# Patient Record
Sex: Male | Born: 1955 | Race: White | Hispanic: No | Marital: Married
Health system: Midwestern US, Community
[De-identification: ages and names within clinical notes are randomized; demographics above are authoritative.]

## PROBLEM LIST (undated history)

## (undated) DIAGNOSIS — E119 Type 2 diabetes mellitus without complications: Secondary | ICD-10-CM

## (undated) DIAGNOSIS — I10 Essential (primary) hypertension: Secondary | ICD-10-CM

## (undated) DIAGNOSIS — E785 Hyperlipidemia, unspecified: Secondary | ICD-10-CM

## (undated) DIAGNOSIS — I341 Nonrheumatic mitral (valve) prolapse: Secondary | ICD-10-CM

## (undated) DIAGNOSIS — E781 Pure hyperglyceridemia: Secondary | ICD-10-CM

## (undated) HISTORY — PX: CARPAL TUNNEL RELEASE: SHX101

## (undated) HISTORY — DX: Type 2 diabetes mellitus without complications: E11.9

## (undated) HISTORY — DX: Hyperlipidemia, unspecified: E78.5

## (undated) HISTORY — DX: Essential (primary) hypertension: I10

## (undated) HISTORY — DX: Nonrheumatic mitral (valve) prolapse: I34.1

## (undated) HISTORY — PX: ROTATOR CUFF REPAIR: SHX139

## (undated) HISTORY — DX: Pure hyperglyceridemia: E78.1

---

## 2014-10-07 ENCOUNTER — Other Ambulatory Visit (HOSPITAL_COMMUNITY): Payer: Self-pay | Admitting: Respiratory Therapy

## 2014-10-07 DIAGNOSIS — G4730 Sleep apnea, unspecified: Principal | ICD-10-CM

## 2014-10-07 DIAGNOSIS — G4710 Hypersomnia, unspecified: Secondary | ICD-10-CM

## 2014-10-23 ENCOUNTER — Ambulatory Visit: Payer: Self-pay

## 2015-01-13 ENCOUNTER — Other Ambulatory Visit (HOSPITAL_COMMUNITY): Payer: Self-pay | Admitting: Respiratory Therapy

## 2015-08-17 ENCOUNTER — Telehealth: Payer: Self-pay | Admitting: Internal Medicine

## 2015-08-17 NOTE — Telephone Encounter (Signed)
Received records from Dr Dwana MelenaZack Hall for appointment on 08/31/15 with Dr Rennis GoldenHilty.  [facility] for Dr Blanchie DessertHilty's schedule on 08/31/15. lp

## 2015-08-31 ENCOUNTER — Ambulatory Visit: Payer: Self-pay | Admitting: Internal Medicine

## 2015-09-10 ENCOUNTER — Ambulatory Visit (INDEPENDENT_AMBULATORY_CARE_PROVIDER_SITE_OTHER): Payer: Managed Care, Other (non HMO) | Admitting: Internal Medicine

## 2015-09-10 ENCOUNTER — Encounter: Payer: Self-pay | Admitting: Internal Medicine

## 2015-09-10 VITALS — BP 150/76 | HR 79 | Ht 72.0 in | Wt 268.0 lb

## 2015-09-10 DIAGNOSIS — R011 Cardiac murmur, unspecified: Secondary | ICD-10-CM

## 2015-09-10 DIAGNOSIS — M7989 Other specified soft tissue disorders: Secondary | ICD-10-CM

## 2015-09-10 DIAGNOSIS — E1100 Type 2 diabetes mellitus with hyperosmolarity without nonketotic hyperglycemic-hyperosmolar coma (NKHHC): Secondary | ICD-10-CM

## 2015-09-10 DIAGNOSIS — R5383 Other fatigue: Secondary | ICD-10-CM | POA: Diagnosis not present

## 2015-09-10 DIAGNOSIS — R0602 Shortness of breath: Secondary | ICD-10-CM

## 2015-09-10 DIAGNOSIS — E785 Hyperlipidemia, unspecified: Secondary | ICD-10-CM | POA: Insufficient documentation

## 2015-09-10 DIAGNOSIS — E119 Type 2 diabetes mellitus without complications: Secondary | ICD-10-CM | POA: Insufficient documentation

## 2015-09-10 DIAGNOSIS — I10 Essential (primary) hypertension: Secondary | ICD-10-CM

## 2015-09-10 NOTE — Progress Notes (Signed)
OFFICE NOTE  Chief Complaint:  DOE, weight gain, swelling  Primary Care Physician: No primary care provider on file.  HPI:  Dwayne Kennedy is a 60 y.o. male with a past medical history significant for mitral valve prolapse as diagnosed by a cardiologist in [place], Dr. Jerene Bears. Apparently he had chest pain and underwent echocardiogram and stress testing when he was age 65, which was 19 years ago. At that time he had a stress test which was abnormal and underwent diagnostic heart catheterization by his report which showed no significant coronary disease. He generally did well and was followed by the cardiologist for at least another 10 years but has not seen one since moving to [place]. Recently he's become more short of breath with exertion. He says his gained about 30-40 pounds over the past 3 years. He's recently had worsening swelling in his ankles and recently fractured his right heel. He gets short of breath when bending over and with exertion. He denies any chest pain. Although he is active at his job he denies any exercise. He does use at least a moderate amount of alcohol drinking approximately 12 alcoholic beverages per week. He's never been a smoker. He does have risk factors including diabetes, hypertension and dyslipidemia. He denies any orthopnea, PND or other heart failure symptoms. He has noted progressive fatigue. He reports having had a home sleep study which was mildly abnormal and may investigate getting an oral appliance to treat very mild sleep apnea.  PMHx:  Past Medical History  Diagnosis Date  . Diabetes mellitus without complication (HCC)   . Hyperlipidemia   . Hypertension   . Mitral valve prolapse     No past surgical history on file.  FAMHx:  Family History  Problem Relation Age of Onset  . Cancer Mother   . Diabetes Father   . Heart disease Father     SOCHx:   reports that he has been passively smoking.  He does not have any smokeless tobacco  history on file. He reports that he drinks about 7.2 oz of alcohol per week. He reports that he does not use illicit drugs.  ALLERGIES:  No Known Allergies  ROS: Pertinent items noted in HPI and remainder of comprehensive ROS otherwise negative.  HOME MEDS: Current Outpatient Prescriptions  Medication Sig Dispense Refill  . aspirin 81 MG tablet Take 81 mg by mouth daily.    Marland Kitchen ezetimibe (ZETIA) 10 MG tablet Take 10 mg by mouth daily.    . fenofibrate (TRICOR) 48 MG tablet Take 48 mg by mouth daily.    . Magnesium 250 MG TABS Take 2 tablets by mouth daily.    . metFORMIN (GLUCOPHAGE) 500 MG tablet Take 500 mg by mouth 2 (two) times daily with a meal.    . Multiple Vitamins-Minerals (MULTIVITAMIN ADULT PO) Take 1 tablet by mouth daily. Centrum    . ramipril (ALTACE) 5 MG capsule Take 5 mg by mouth daily.    . rosuvastatin (CRESTOR) 40 MG tablet Take 40 mg by mouth daily.     No current facility-administered medications for this visit.    LABS/IMAGING: No results found for this or any previous visit (from the past 48 hour(s)). No results found.  WEIGHTS: Wt Readings from Last 3 Encounters:  09/10/15 268 lb (121.564 kg)    VITALS: BP 150/76 mmHg  Pulse 79  Ht 6' (1.829 m)  Wt 268 lb (121.564 kg)  BMI 36.34 kg/m2  EXAM: General appearance:  alert and no distress Neck: no carotid bruit, no JVD and thyroid not enlarged, symmetric, no tenderness/mass/nodules Lungs: clear to auscultation bilaterally Heart: regular rate and rhythm, S1, S2 normal, systolic murmur: early systolic 2/6, blowing at apex and mid systolic click present Abdomen: soft, non-tender; bowel sounds normal; no masses,  no organomegaly and obese, protuberant Extremities: edema trace bilateral pedal edema Pulses: 2+ and symmetric Skin: Skin color, texture, turgor normal. No rashes or lesions Neurologic: Grossly normal Psych: Pleasant  EKG: Normal sinus rhythm, right bundle branch  block  ASSESSMENT: 1. Progressive dyspnea and exertion 2. RBBB 3. Weight gain with edema 4. History of mitral valve prolapse/murmur 5. Hypertension 6. Dyslipidemia 7. Type 2 diabetes  PLAN: 1.   Mr. Dwayne Kennedy is had some worsening shortness of breath with exertion and weight gain that is been over the past 3-4 years. I suspect given his protuberant abdomen that he has some restriction leading to shortness of breath. He also has some leg swelling. I like to rule out any right heart dysfunction as there is evidence of right bundle branch block on EKG. We'll go ahead and order an echocardiogram. We'll also go ahead and check an exercise stress test to rule out ischemia. Although he had normal coronaries by report almost 20 years ago, he has multiple coronary risk factors. We'll try to obtain his old cath report if possible. Unfortunately, he tells me he is traveling next week therefore we'll delay stress testing probably until he gets back. He should be able to get those at [facility] for his convenience.  Follow-up in several weeks after stress test.  Chrystie NoseKenneth C. Aliyana Dlugosz, MD, [facility] Attending Cardiologist [facility]  Chrystie NoseKenneth C Annabell Oconnor 09/10/2015, 5:15 PM

## 2015-09-10 NOTE — Patient Instructions (Addendum)
Your physician has requested that you have an echocardiogram @ [facility]. Echocardiography is a painless test that uses sound waves to create images of your heart. It provides your doctor with information about the size and shape of your heart and how well your heart's chambers and valves are working. This procedure takes approximately one hour. There are no restrictions for this procedure.  Your physician has requested that you have an exercise stress myoview @ [facility]. For further information please visit [email or web address]. Please follow instruction sheet, as given.  Your physician recommends that you schedule a follow-up appointment after your testing.   Records request faxed to Dr. Altha Harmavid Cutler in [place] ([phone]) [phone]

## 2015-09-13 ENCOUNTER — Encounter: Payer: Self-pay | Admitting: *Deleted

## 2015-09-13 NOTE — Telephone Encounter (Signed)
[facility], sent to [employer] and scanning.

## 2015-09-22 ENCOUNTER — Telehealth: Payer: Self-pay | Admitting: Internal Medicine

## 2015-09-22 NOTE — Telephone Encounter (Signed)
[facility].  Records given to Dr Rennis GoldenHilty for review.

## 2015-09-24 ENCOUNTER — Ambulatory Visit (HOSPITAL_COMMUNITY)
Admission: RE | Admit: 2015-09-24 | Discharge: 2015-09-24 | Disposition: A | Discharge status: Home / Self Care | Payer: Managed Care, Other (non HMO) | Source: Ambulatory Visit | Attending: Internal Medicine | Admitting: Internal Medicine

## 2015-09-24 ENCOUNTER — Encounter (HOSPITAL_COMMUNITY): Admission: RE | Admit: 2015-09-24 | Payer: Managed Care, Other (non HMO) | Source: Ambulatory Visit

## 2015-09-24 ENCOUNTER — Encounter (HOSPITAL_COMMUNITY)
Admission: RE | Admit: 2015-09-24 | Discharge: 2015-09-24 | Disposition: A | Discharge status: Home / Self Care | Payer: Managed Care, Other (non HMO) | Source: Ambulatory Visit | Attending: Internal Medicine | Admitting: Internal Medicine

## 2015-09-24 ENCOUNTER — Inpatient Hospital Stay (HOSPITAL_COMMUNITY): Admission: RE | Admit: 2015-09-24 | Payer: Managed Care, Other (non HMO) | Source: Ambulatory Visit

## 2015-09-24 DIAGNOSIS — M7989 Other specified soft tissue disorders: Secondary | ICD-10-CM

## 2015-09-24 DIAGNOSIS — R0602 Shortness of breath: Secondary | ICD-10-CM | POA: Diagnosis not present

## 2015-09-24 DIAGNOSIS — R5383 Other fatigue: Secondary | ICD-10-CM | POA: Insufficient documentation

## 2015-09-24 DIAGNOSIS — R011 Cardiac murmur, unspecified: Secondary | ICD-10-CM | POA: Diagnosis not present

## 2015-09-24 DIAGNOSIS — R0600 Dyspnea, unspecified: Secondary | ICD-10-CM | POA: Diagnosis present

## 2015-09-24 DIAGNOSIS — E785 Hyperlipidemia, unspecified: Secondary | ICD-10-CM | POA: Insufficient documentation

## 2015-09-24 DIAGNOSIS — E119 Type 2 diabetes mellitus without complications: Secondary | ICD-10-CM | POA: Diagnosis not present

## 2015-09-24 DIAGNOSIS — I510 Cardiac septal defect, acquired: Secondary | ICD-10-CM | POA: Insufficient documentation

## 2015-09-24 NOTE — Progress Notes (Signed)
*  PRELIMINARY RESULTS* Echocardiogram 2D Echocardiogram has been performed.  Jeryl Columbialliott, Tephanie Escorcia 09/24/2015, 9:30 AM

## 2015-09-27 ENCOUNTER — Encounter (HOSPITAL_BASED_OUTPATIENT_CLINIC_OR_DEPARTMENT_OTHER)
Admission: RE | Admit: 2015-09-27 | Discharge: 2015-09-27 | Disposition: A | Discharge status: Home / Self Care | Payer: Managed Care, Other (non HMO) | Source: Ambulatory Visit | Attending: Internal Medicine | Admitting: Internal Medicine

## 2015-09-27 ENCOUNTER — Encounter (HOSPITAL_COMMUNITY): Payer: Self-pay

## 2015-09-27 ENCOUNTER — Inpatient Hospital Stay (HOSPITAL_COMMUNITY): Admission: RE | Admit: 2015-09-27 | Payer: Managed Care, Other (non HMO) | Source: Ambulatory Visit

## 2015-09-27 ENCOUNTER — Encounter (HOSPITAL_COMMUNITY)
Admission: RE | Admit: 2015-09-27 | Discharge: 2015-09-27 | Disposition: A | Discharge status: Home / Self Care | Payer: Managed Care, Other (non HMO) | Source: Ambulatory Visit | Attending: Internal Medicine | Admitting: Internal Medicine

## 2015-09-27 DIAGNOSIS — I510 Cardiac septal defect, acquired: Secondary | ICD-10-CM | POA: Diagnosis not present

## 2015-09-27 DIAGNOSIS — R0602 Shortness of breath: Secondary | ICD-10-CM | POA: Diagnosis present

## 2015-09-27 DIAGNOSIS — R5383 Other fatigue: Secondary | ICD-10-CM

## 2015-09-27 LAB — NM MYOCAR MULTI W/SPECT W/WALL MOTION / EF
CHL CUP NUCLEAR SDS: 0
CHL CUP RESTING HR STRESS: 68 {beats}/min
CHL RATE OF PERCEIVED EXERTION: 15
CSEPEDS: 48 s
CSEPEW: 10.1 METS
Exercise duration (min): 7 min
LV dias vol: 95 mL (ref 62–150)
LVSYSVOL: 38 mL
MPHR: 161 {beats}/min
Peak HR: 157 {beats}/min
Percent HR: 97 %
RATE: 0.35
SRS: 2
SSS: 2
TID: 1

## 2015-09-27 MED ORDER — SODIUM CHLORIDE 0.9% FLUSH
INTRAVENOUS | Status: AC
  Administered 2015-09-27: 10 mL via INTRAVENOUS
  Filled 2015-09-27: qty 10

## 2015-09-27 MED ORDER — TECHNETIUM TC 99M SESTAMIBI GENERIC - CARDIOLITE
10.0000 | Freq: Once | INTRAVENOUS | Status: AC | PRN
  Administered 2015-09-27: 9.97 via INTRAVENOUS

## 2015-09-27 MED ORDER — TECHNETIUM TC 99M SESTAMIBI - CARDIOLITE
30.0000 | Freq: Once | INTRAVENOUS | Status: AC | PRN
  Administered 2015-09-27: 30 via INTRAVENOUS

## 2015-09-27 MED ORDER — REGADENOSON 0.4 MG/5ML IV SOLN
INTRAVENOUS | Status: DC
Start: 2015-09-27 — End: 2015-09-27
  Filled 2015-09-27: qty 5

## 2015-09-29 ENCOUNTER — Telehealth: Payer: Self-pay | Admitting: Internal Medicine

## 2015-09-29 NOTE — Telephone Encounter (Signed)
New message ° ° ° ° °Returning a call to the nurse °

## 2015-09-29 NOTE — Telephone Encounter (Signed)
Returned call to pt and gave him results of echo per Dr Blanchie DessertHilty's comment. Pt verbalized understanding.

## 2015-10-08 ENCOUNTER — Encounter: Payer: Self-pay | Admitting: Internal Medicine

## 2015-10-08 ENCOUNTER — Ambulatory Visit (INDEPENDENT_AMBULATORY_CARE_PROVIDER_SITE_OTHER): Payer: Managed Care, Other (non HMO) | Admitting: Internal Medicine

## 2015-10-08 VITALS — BP 156/95 | HR 75 | Ht 72.0 in | Wt 256.2 lb

## 2015-10-08 DIAGNOSIS — I10 Essential (primary) hypertension: Secondary | ICD-10-CM | POA: Diagnosis not present

## 2015-10-08 DIAGNOSIS — R0602 Shortness of breath: Secondary | ICD-10-CM

## 2015-10-08 DIAGNOSIS — M7989 Other specified soft tissue disorders: Secondary | ICD-10-CM | POA: Diagnosis not present

## 2015-10-08 DIAGNOSIS — R5383 Other fatigue: Secondary | ICD-10-CM

## 2015-10-08 NOTE — Patient Instructions (Addendum)
Your physician recommends that you schedule a follow-up appointment as needed  Patient is to call the office when he returns from traveling - Dr. Rennis GoldenHilty will discuss a diuretic at that time.

## 2015-10-09 NOTE — Progress Notes (Signed)
OFFICE NOTE  Chief Complaint:  Follow up tests  Primary Care Physician: Dwayne MelenaZack Hall, MD  HPI:  Dwayne Kennedy is a 60 y.o. male with a past medical history significant for mitral valve prolapse as diagnosed by a cardiologist in [place], Dr. Jerene Bearsavid Kennedy. Apparently he had chest pain and underwent echocardiogram and stress testing when he was age 60, which was 19 years ago. At that time he had a stress test which was abnormal and underwent diagnostic heart catheterization by his report which showed no significant coronary disease. He generally did well and was followed by the cardiologist for at least another 10 years but has not seen one since moving to [place]. Recently he's become more short of breath with exertion. He says his gained about 30-40 pounds over the past 3 years. He's recently had worsening swelling in his ankles and recently fractured his right heel. He gets short of breath when bending over and with exertion. He denies any chest pain. Although he is active at his job he denies any exercise. He does use at least a moderate amount of alcohol drinking approximately 12 alcoholic beverages per week. He's never been a smoker. He does have risk factors including diabetes, hypertension and dyslipidemia. He denies any orthopnea, PND or other heart failure symptoms. He has noted progressive fatigue. He reports having had a home sleep study which was mildly abnormal and may investigate getting an oral appliance to treat very mild sleep apnea.  10/08/2015  Dwayne Kennedy returns today for follow-up. He underwent nuclear stress testing as well as an echocardiogram. The echo demonstrated normal systolic and diastolic function with an EF of 60-65% and no significant valvular disease. His nuclear stress test indicated a low risk study without any reversible ischemia and a small inferior attenuation artifact. I received results from his [facility]. Dr.  Jerene Bearsavid Kennedy noted that he saw Dwayne Kennedy in 2010 and 2011. He referenced a cardiac catheterization in 2004 which was normal. There was also a stress echocardiogram performed in 2010 which was negative for ischemia. No clear cause for Dwayne Kennedy's chest discomfort was noted.  PMHx:  Past Medical History  Diagnosis Date  . Diabetes mellitus without complication (HCC)   . Hyperlipidemia   . Hypertension   . Mitral valve prolapse     Past Surgical History  Procedure Laterality Date  . Carpal tunnel release Right   . Rotator cuff repair Right     FAMHx:  Family History  Problem Relation Age of Onset  . Cancer Mother   . Diabetes Father   . Heart disease Father   . Other Child     meningitis - COD at age 365    SOCHx:   reports that he has been passively smoking.  He does not have any smokeless tobacco history on file. He reports that he drinks about 7.2 oz of alcohol per week. He reports that he does not use illicit drugs.  ALLERGIES:  No Known Allergies  ROS: Pertinent items noted in HPI and remainder of comprehensive ROS otherwise negative.  HOME MEDS: Current Outpatient Prescriptions  Medication Sig Dispense Refill  . aspirin 81 MG tablet Take 81 mg by mouth daily.    Marland Kitchen. ezetimibe (ZETIA) 10 MG tablet Take 10 mg by mouth daily.    . fenofibrate (TRICOR) 48 MG tablet Take 48 mg by mouth daily.    . Magnesium 250 MG TABS Take 2 tablets by mouth daily.    .Marland Kitchen  metFORMIN (GLUCOPHAGE) 500 MG tablet Take 500 mg by mouth 2 (two) times daily with a meal.    . Multiple Vitamins-Minerals (MULTIVITAMIN ADULT PO) Take 1 tablet by mouth daily. Centrum    . ramipril (ALTACE) 5 MG capsule Take 5 mg by mouth daily.    . rosuvastatin (CRESTOR) 40 MG tablet Take 40 mg by mouth daily.     No current facility-administered medications for this visit.    LABS/IMAGING: No results found for this or any previous visit (from the past 48 hour(s)). No results found.  WEIGHTS: Wt Readings from  Last 3 Encounters:  10/08/15 256 lb 3.2 oz (116.212 kg)  09/10/15 268 lb (121.564 kg)    VITALS: BP 156/95 mmHg  Pulse 75  Ht 6' (1.829 m)  Wt 256 lb 3.2 oz (116.212 kg)  BMI 34.74 kg/m2  EXAM: Deferred  EKG: Deferred  ASSESSMENT: 1. Progressive dyspnea and exertion- normal echo and low risk stress test findings, EF 60-65% 2. RBBB 3. Weight gain with edema 4. History of mitral valve prolapse/murmur - not seen on echo  5. Hypertension 6. Dyslipidemia 7. Type 2 diabetes  PLAN: 1.   Mr. Dwayne Kennedy at a low risk stress test and an echocardiogram which appears normal. There was no evidence of significant mitral valve prolapse as reported in the past. He's had a negative cardiac catheterization 2004 and a normal stress echocardiogram in 2010. Do not believe his symptoms are cardiac in nature. He may wish to pursue a pulmonary workup through his primary care provider. He continues to have some leg edema. This is likely due to some venous insufficiency. He would benefit from compression stockings and possibly a low-dose diuretic such as Lasix or chlorthalidone. Blood pressure was mildly elevated today. I offered to start chlorthalidone however he says he will be traveling next 3-4 weeks and would need to get blood work within 1 week of starting medication. He may contact our office to start that medication once he returns back to [place].  Dwayne Nose, MD, [facility] Attending Cardiologist [facility]  Dwayne Kennedy 10/09/2015, 11:34 AM

## 2016-07-20 DIAGNOSIS — E785 Hyperlipidemia, unspecified: Secondary | ICD-10-CM | POA: Insufficient documentation

## 2017-04-08 IMAGING — NM NM MYOCAR MULTI W/SPECT W/WALL MOTION & EF
2 series · 12 of 12 positions shown · non-contrast
Comparison: none

[Series 1: rest · 8.28mm/px · 6 of 64 frames shown]
[frame 6/64]
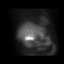
[frame 16/64]
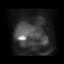
[frame 27/64]
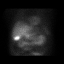
[frame 38/64]
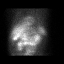
[frame 48/64]
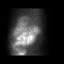
[frame 59/64]
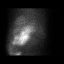

[Series 2: stress gated · 8.28mm/px · 6 of 64 frames shown]
[frame 6/64]
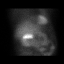
[frame 16/64]
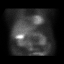
[frame 27/64]
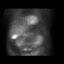
[frame 38/64]
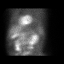
[frame 48/64]
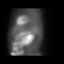
[frame 59/64]
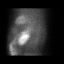

[12 of 12 positions shown; findings below may reference images not displayed]

Canned report from images found in remote index.

Refer to host system for actual result text.

## 2017-07-24 ENCOUNTER — Ambulatory Visit: Payer: Managed Care, Other (non HMO) | Admitting: General Surgery

## 2020-11-30 DIAGNOSIS — S299XXA Unspecified injury of thorax, initial encounter: Secondary | ICD-10-CM | POA: Insufficient documentation

## 2020-11-30 DIAGNOSIS — E66812 Obesity, class 2: Secondary | ICD-10-CM | POA: Insufficient documentation

## 2021-01-21 ENCOUNTER — Encounter: Payer: Self-pay | Admitting: Pulmonary Disease

## 2021-01-21 ENCOUNTER — Ambulatory Visit (INDEPENDENT_AMBULATORY_CARE_PROVIDER_SITE_OTHER): Payer: PRIVATE HEALTH INSURANCE | Admitting: Pulmonary Disease

## 2021-01-21 ENCOUNTER — Other Ambulatory Visit: Payer: Self-pay

## 2021-01-21 DIAGNOSIS — R0683 Snoring: Secondary | ICD-10-CM | POA: Diagnosis not present

## 2021-01-21 NOTE — Patient Instructions (Addendum)
Home sleep study 

## 2021-01-21 NOTE — Progress Notes (Signed)
Subjective:    Patient ID: Dwayne Kennedy, male    DOB: 1956-03-01, 65 y.o.   MRN: [id number]  HPI  Chief Complaint  Patient presents with   Consult    Patient snores, wakes up frequently and feels restless when sleeping. Denies that he stops breathing or gasping for air.     65 year old obese diabetic, hypertensive presents for evaluation of sleep disordered breathing. He reports undergoing a home sleep test in 2018 and was told that he had a mild condition.  His wife uses a CPAP machine and he is not very enthusiastic about using 1. His wife is noted loud snoring and he reports nonrefreshing sleep.  Epworth sleepiness score is 7 and reports some sleepiness while sitting and reading and watching TV. He denies gasping or choking episodes in his sleep. Bedtime is between 8 and 9 PM, sleep latency is up to an hour, he sleeps on his side with 1 pillow, reports 1-2 spontaneous nocturnal awakenings, is out of bed around 5 AM feeling tired without dryness of mouth or headaches. He has gained about 10 pounds in the last 2 years  There is no history suggestive of cataplexy, sleep paralysis or parasomnias   Past Medical History:  Diagnosis Date   Diabetes mellitus without complication (HCC)    Hyperlipidemia    Hypertension    Mitral valve prolapse    Past Surgical History:  Procedure Laterality Date   CARPAL TUNNEL RELEASE Right    ROTATOR CUFF REPAIR Right      No Known Allergies  Social History   Socioeconomic History   Marital status: Married    Spouse name: Not on file   Number of children: 2   Years of education: 12   Highest education level: Not on file  Occupational History   Occupation: Diplomatic Services operational officer    Employer: [employer]  Tobacco Use   Smoking status: Never    Passive exposure: Yes   Smokeless tobacco: Never   Tobacco comments:    smoked occasionally in high school  Vaping Use   Vaping Use: Never used  Substance and Sexual Activity   Alcohol use:  Yes    Alcohol/week: 12.0 standard drinks    Types: 12 Standard drinks or equivalent per week    Comment: 2-3 drinks daily   Drug use: No   Sexual activity: Not on file  Other Topics Concern   Not on file  Social History Narrative   epworth sleepiness scale = 5 (09/10/15)   Social Determinants of Health   Financial Resource Strain: Not on file  Food Insecurity: Not on file  Transportation Needs: Not on file  Physical Activity: Not on file  Stress: Not on file  Social Connections: Not on file  Intimate Partner Violence: Not on file    Family History  Problem Relation Age of Onset   Cancer Mother    Diabetes Father    Heart disease Father    Other Child        meningitis - COD at age 65     Review of Systems Constitutional: negative for anorexia, fevers and sweats  Eyes: negative for irritation, redness and visual disturbance  Ears, nose, mouth, throat, and face: negative for earaches, epistaxis, nasal congestion and sore throat  Respiratory: negative for cough, dyspnea on exertion, sputum and wheezing  Cardiovascular: negative for chest pain, dyspnea, lower extremity edema, orthopnea, palpitations and syncope  Gastrointestinal: negative for abdominal pain, constipation, diarrhea, melena, nausea and vomiting  Genitourinary:negative for dysuria, frequency and hematuria  Hematologic/lymphatic: negative for bleeding, easy bruising and lymphadenopathy  Musculoskeletal:negative for arthralgias, muscle weakness and stiff joints  Neurological: negative for coordination problems, gait problems, headaches and weakness  Endocrine: negative for diabetic symptoms including polydipsia, polyuria and weight loss     Objective:   Physical Exam   Gen. Pleasant, obese, in no distress, normal affect ENT - no pallor,icterus, no post nasal drip, class 2 airway Neck: No JVD, no thyromegaly, no carotid bruits Lungs: no use of accessory muscles, no dullness to percussion, decreased without  rales or rhonchi  Cardiovascular: Rhythm regular, heart sounds  normal, no murmurs or gallops, no peripheral edema Abdomen: soft and non-tender, no hepatosplenomegaly, BS normal. Musculoskeletal: No deformities, no cyanosis or clubbing Neuro:  alert, non focal, no tremors        Assessment & Plan:

## 2021-01-21 NOTE — Assessment & Plan Note (Signed)
Given excessive daytime somnolence, narrow pharyngeal exam & loud snoring, obstructive sleep apnea is very likely & an overnight polysomnogram will be scheduled as a home study. The pathophysiology of obstructive sleep apnea , it's cardiovascular consequences & modes of treatment including CPAP were discused with the patient in detail & they evidenced understanding.  Pretest probability is intermediate. He does not want to use a CPAP machine.  I discussed alternatives including oral appliance and inspire device.  Would only treat if he has at least moderate OSA.  Weight loss was encouraged

## 2022-04-20 LAB — COLOGUARD: COLOGUARD: NEGATIVE

## 2023-02-26 ENCOUNTER — Ambulatory Visit: Payer: PRIVATE HEALTH INSURANCE | Admitting: Internal Medicine

## 2023-10-22 DIAGNOSIS — E1169 Type 2 diabetes mellitus with other specified complication: Secondary | ICD-10-CM | POA: Diagnosis not present

## 2023-10-22 DIAGNOSIS — I10 Essential (primary) hypertension: Secondary | ICD-10-CM | POA: Diagnosis not present

## 2023-10-22 DIAGNOSIS — E782 Mixed hyperlipidemia: Secondary | ICD-10-CM | POA: Diagnosis not present

## 2023-10-22 DIAGNOSIS — E7800 Pure hypercholesterolemia, unspecified: Secondary | ICD-10-CM | POA: Diagnosis not present

## 2024-03-27 ENCOUNTER — Ambulatory Visit (INDEPENDENT_AMBULATORY_CARE_PROVIDER_SITE_OTHER)

## 2024-03-27 ENCOUNTER — Ambulatory Visit
Admission: RE | Admit: 2024-03-27 | Discharge: 2024-03-27 | Disposition: A | Discharge status: Home / Self Care | Attending: Family Medicine | Admitting: Family Medicine

## 2024-03-27 VITALS — BP 160/89 | HR 89 | Temp 97.9°F | Resp 20

## 2024-03-27 DIAGNOSIS — S62101A Fracture of unspecified carpal bone, right wrist, initial encounter for closed fracture: Secondary | ICD-10-CM

## 2024-03-27 DIAGNOSIS — S99921A Unspecified injury of right foot, initial encounter: Secondary | ICD-10-CM | POA: Diagnosis not present

## 2024-03-27 DIAGNOSIS — S6991XA Unspecified injury of right wrist, hand and finger(s), initial encounter: Secondary | ICD-10-CM | POA: Diagnosis not present

## 2024-03-27 DIAGNOSIS — R936 Abnormal findings on diagnostic imaging of limbs: Secondary | ICD-10-CM | POA: Diagnosis not present

## 2024-03-27 DIAGNOSIS — S92421A Displaced fracture of distal phalanx of right great toe, initial encounter for closed fracture: Secondary | ICD-10-CM | POA: Diagnosis not present

## 2024-03-27 NOTE — ED Triage Notes (Addendum)
 Pt reports right wrist pain., right foot great toe pain swollen and bruised. after tripping and falling on Monday some swelling present in both areas Feels warmer to the touch near thew wrist pain is present when moving from left to right less pain when moving up and down, pt has been using tylenol and Advil

## 2024-03-27 NOTE — Discharge Instructions (Signed)
 The xrays show broken bones in both your right great toe and right wrist.  Please wear the post op shoe and splint until you follow up with an Orthopedic provider.  In the meantime, continue Tylenol as needed for pain.  You can also elevate your wrist and toes to help with pain and swelling as well as apply ice.  Please call an Orthopedic provider to schedule a follow up visit.

## 2024-03-27 NOTE — ED Provider Notes (Signed)
 [facility]    CSN: [id number] Arrival date & time: 03/27/24  0845      History   Chief Complaint Chief Complaint  Patient presents with   Wrist Pain    Clemens on Monday afternoon and landed on right wrist. Not a lot of swelling but pain - Entered by patient    HPI Dwayne Kennedy is a 68 y.o. male.   Patient presents today with 4-day history of right wrist and great toe pain after a fall onto his outstretched hand.  Reports the wrist is swollen and difficult to internally externally rotate.  No numbness or tingling in the digits and no discoloration of the wrist.  No weakness of the right upper extremity.  The right great toe is bruised and painful to move.  No fevers or nausea/vomiting.  He is not taking Tylenol and Advil for pain with improvement in pain.     Past Medical History:  Diagnosis Date   Diabetes mellitus without complication (HCC)    Hyperlipidemia    Hypertension    Mitral valve prolapse     Patient Active Problem List   Diagnosis Date Noted   Snoring 01/21/2021   Shortness of breath 09/10/2015   Other fatigue 09/10/2015   Leg swelling 09/10/2015   Murmur 09/10/2015   Dyslipidemia 09/10/2015   Essential hypertension 09/10/2015   DM2 (diabetes mellitus, type 2) (HCC) 09/10/2015    Past Surgical History:  Procedure Laterality Date   CARPAL TUNNEL RELEASE Right    ROTATOR CUFF REPAIR Right        Home Medications    Prior to Admission medications   Medication Sig Start Date End Date Taking? Authorizing Provider  aspirin 81 MG tablet Take 81 mg by mouth daily.    [provider]  Cholecalciferol 1.25 MG (50000 UT) TABS Take 1,000 Int'l Units/kg/day by mouth daily.    [provider]  co-enzyme Q-10 30 MG capsule Take 100 mg by mouth daily.    [provider]  ezetimibe (ZETIA) 10 MG tablet Take 10 mg by mouth daily.    [provider]  fenofibrate (TRICOR) 48 MG tablet Take 48 mg by mouth daily.     [provider]  Ibuprofen-diphenhydrAMINE HCl (ADVIL PM) 200-25 MG CAPS Take 200 mg by mouth daily.    [provider]  Magnesium 250 MG TABS Take 2 tablets by mouth daily.    [provider]  Melatonin 10 MG TABS Take by mouth.    [provider]  metFORMIN (GLUCOPHAGE) 500 MG tablet Take 500 mg by mouth 2 (two) times daily with a meal.    [provider]  Multiple Vitamins-Minerals (MULTIVITAMIN ADULT PO) Take 1 tablet by mouth daily. Centrum    [provider]  Omega-3 Krill Oil 500 MG CAPS Take 500 mg by mouth in the morning and at bedtime.    [provider]  ramipril (ALTACE) 5 MG capsule Take 5 mg by mouth daily.    [provider]  rosuvastatin (CRESTOR) 40 MG tablet Take 40 mg by mouth daily.    [provider]  zinc gluconate 50 MG tablet Take by mouth.    [provider]    Family History Family History  Problem Relation Age of Onset   Cancer Mother    Diabetes Father    Heart disease Father    Other Child        meningitis - COD at age 61  Social History Social History   Tobacco Use   Smoking status: Never    Passive exposure: Yes   Smokeless tobacco: Never   Tobacco comments:    smoked occasionally in high school  Vaping Use   Vaping status: Never Used  Substance Use Topics   Alcohol use: Yes    Alcohol/week: 12.0 standard drinks of alcohol    Types: 12 Standard drinks or equivalent per week    Comment: 2-3 drinks daily   Drug use: No     Allergies   Patient has no known allergies.   Review of Systems Review of Systems Per HPI  Physical Exam Triage Vital Signs ED Triage Vitals  Encounter Vitals Group     BP 03/27/24 0857 (!) 160/89     Girls Systolic BP Percentile --      Girls Diastolic BP Percentile --      Boys Systolic BP Percentile --      Boys Diastolic BP Percentile --      Pulse Rate 03/27/24 0857 89     Resp 03/27/24 0857 20     Temp 03/27/24  0857 97.9 F (36.6 C)     Temp Source 03/27/24 0857 Oral     SpO2 03/27/24 0857 95 %     Weight --      Height --      Head Circumference --      Peak Flow --      Pain Score 03/27/24 0859 2     Pain Loc --      Pain Education --      Exclude from Growth Chart --    No data found.  Updated Vital Signs BP (!) 160/89 (BP Location: Right Arm)   Pulse 89   Temp 97.9 F (36.6 C) (Oral)   Resp 20   SpO2 95%   Visual Acuity Right Eye Distance:   Left Eye Distance:   Bilateral Distance:    Right Eye Near:   Left Eye Near:    Bilateral Near:     Physical Exam Vitals and nursing note reviewed.  Constitutional:      General: He is not in acute distress.    Appearance: Normal appearance. He is not toxic-appearing.  Pulmonary:     Effort: Pulmonary effort is normal. No respiratory distress.  Musculoskeletal:     Right lower leg: No edema.     Left lower leg: No edema.     Comments: Inspection: Mild swelling noted to dorsal aspect of right wrist without any bruising, redness, obvious deformity, or warmth.  There is swelling and bruising noted to right great toe without any obvious deformity or redness or warmth.  No swelling, bruising, obvious deformity or redness Palpation: Right dorsal wrist tender to palpation on the dorsal aspect over the capitate, lunate, trapezium, and hamate; no obvious deformities palpated.  Right great toe is tender to palpation distally. ROM: Full ROM to right wrist, although pain with internal/external rotation.  Range of motion difficult to assess of right great toe secondary to swelling and pain Strength: 5/5 bilateral upper and lower extremities Neurovascular: neurovascularly intact in bilateral upper and lower extremities  Skin:    General: Skin is warm and dry.     Capillary Refill: Capillary refill takes less than 2 seconds.     Coloration: Skin is not jaundiced or pale.     Findings: No erythema.  Neurological:     Mental Status: He is alert  and oriented to  person, place, and time.  Psychiatric:        Behavior: Behavior is cooperative.      UC Treatments / Results  Labs (all labs ordered are listed, but only abnormal results are displayed) Labs Reviewed - No data to display  EKG   Radiology DG Toe Great Right Result Date: 03/27/2024 CLINICAL DATA:  pt reports fall after tripping EXAM: RIGHT GREAT TOE COMPARISON:  None Available. FINDINGS: Longitudinally oriented lucency through the lateral base of the first distal phalanx. A subtle lucency is also noted through the medial aspect of the distal phalangeal tuft. No dislocation. There is no evidence of arthropathy or other focal bone abnormality. Soft tissues are unremarkable. IMPRESSION: 1. Longitudinally oriented lucency through the lateral base of the first distal phalanx, worrisome for a nondisplaced fracture with intra-articular extension. 2. Subtle lucency through the medial aspect of the distal phalangeal tuft, also concerning for a nondisplaced fracture. Electronically Signed   By: Rogelia Myers M.D.   On: 03/27/2024 09:37   DG Wrist Complete Right Result Date: 03/27/2024 CLINICAL DATA:  pt reports fall after tripping EXAM: RIGHT WRIST - COMPLETE 3+ VIEW COMPARISON:  None Available. FINDINGS: Small rounded ossific fragment along the dorsum of the wrist on the lateral radiograph, may reflect an avulsion fracture. No dislocation. There is no evidence of arthropathy or other focal bone abnormality. Soft tissue swelling about the wrist. IMPRESSION: Soft tissue swelling about the wrist. Small rounded ossific fragment along the dorsum of the wrist on the lateral radiograph, may reflect an avulsion fracture. Electronically Signed   By: Rogelia Myers M.D.   On: 03/27/2024 09:34    Procedures Procedures (including critical care time)  Medications Ordered in UC Medications - No data to display  Initial Impression / Assessment and Plan / UC Course  I have reviewed the  triage vital signs and the nursing notes.  Pertinent labs & imaging results that were available during my care of the patient were reviewed by me and considered in my medical decision making (see chart for details).   Patient is mildly hypertensive in triage today, otherwise vital signs are stable.  1. Closed fracture of right wrist, initial encounter Wrist x-ray is suggestive of avulsion fracture over dorsum of wrist Patient is placed in volar splint, recommended close follow-up with Ortho and supportive care discussed in the meantime  2. Closed displaced fracture of distal phalanx of right great toe, initial encounter Toe x-ray shows multiple toe fractures, patient placed in postop shoe and recommend follow-up with Ortho Contact information was given and supportive care discussed in the meantime  The patient was given the opportunity to ask questions.  All questions answered to their satisfaction.  The patient is in agreement to this plan.   Final Clinical Impressions(s) / UC Diagnoses   Final diagnoses:  Closed fracture of right wrist, initial encounter  Closed displaced fracture of distal phalanx of right great toe, initial encounter     Discharge Instructions      The xrays show broken bones in both your right great toe and right wrist.  Please wear the post op shoe and splint until you follow up with an Orthopedic provider.  In the meantime, continue Tylenol as needed for pain.  You can also elevate your wrist and toes to help with pain and swelling as well as apply ice.  Please call an Orthopedic provider to schedule a follow up visit.    ED Prescriptions   None  PDMP not reviewed this encounter.   Chandra Harlene LABOR, NP 03/27/24 [phone]

## 2024-03-28 DIAGNOSIS — E119 Type 2 diabetes mellitus without complications: Secondary | ICD-10-CM | POA: Diagnosis not present

## 2024-03-28 DIAGNOSIS — E782 Mixed hyperlipidemia: Secondary | ICD-10-CM | POA: Diagnosis not present

## 2024-03-28 DIAGNOSIS — I10 Essential (primary) hypertension: Secondary | ICD-10-CM | POA: Diagnosis not present

## 2024-03-28 DIAGNOSIS — R0602 Shortness of breath: Secondary | ICD-10-CM | POA: Diagnosis not present

## 2024-04-02 ENCOUNTER — Ambulatory Visit: Admitting: Orthopedic Surgery

## 2024-04-02 ENCOUNTER — Encounter: Payer: Self-pay | Admitting: Orthopedic Surgery

## 2024-04-02 VITALS — BP 149/89 | HR 79 | Ht 71.5 in | Wt 285.0 lb

## 2024-04-02 DIAGNOSIS — S92424A Nondisplaced fracture of distal phalanx of right great toe, initial encounter for closed fracture: Secondary | ICD-10-CM | POA: Diagnosis not present

## 2024-04-02 DIAGNOSIS — S6291XA Unspecified fracture of right wrist and hand, initial encounter for closed fracture: Secondary | ICD-10-CM

## 2024-04-02 NOTE — Progress Notes (Signed)
 New Patient Visit  Assessment: Dwayne Kennedy is a 68 y.o. male with the following: 1. Closed fracture of right hand, initial encounter 2. Nondisplaced fracture of distal phalanx of right great toe, initial encounter for closed fracture  Plan: Dwayne Kennedy found to stand an avulsion type fracture of the dorsal aspect of the right hand, as well as fractures to the right great toe.  Minimally displaced.  Recommended a brace for his hand.  Okay to remove for hygiene, and gentle motion.  He will continue to buddy tape the great toe.  Weightbearing as tolerated through the heel on the right foot.  Follow-up in 3 weeks.  Medications as needed.  Elevate the hand of the foot to help with swelling.  Follow-up: Return in about 3 weeks (around 04/23/2024).  Subjective:  Chief Complaint  Patient presents with   Fracture    R wrist R big toe DOI 03/24/24. Big toe feeling better and wrist has clicking w/ pain.     History of Present Illness: Dwayne Kennedy is a 68 y.o. male who presents for evaluation of right hand and right foot pain.  He states he fell, approximately week ago while at home.  He injured his right wrist, as well as his right great toe.  He was evaluated at an urgent care center.  Radiographs demonstrated a fracture of the hand, as well as the great toe.  He has been buddy taping his great toe.  He is primarily walking through his heel.  His right wrist was placed in the splint.  No numbness or tingling.  He has been taking Tylenol and Aleve for pain.   Review of Systems: No fevers or chills No numbness or tingling No chest pain No shortness of breath No bowel or bladder dysfunction No GI distress No headaches   Medical History:  Past Medical History:  Diagnosis Date   Diabetes mellitus without complication (HCC)    Hyperlipidemia    Hypertension    Mitral valve prolapse     Past Surgical History:  Procedure Laterality Date   CARPAL TUNNEL RELEASE Right    ROTATOR CUFF  REPAIR Right     Family History  Problem Relation Age of Onset   Cancer Mother    Diabetes Father    Heart disease Father    Other Child        meningitis - COD at age 81   Social History   Tobacco Use   Smoking status: Never    Passive exposure: Yes   Smokeless tobacco: Never   Tobacco comments:    smoked occasionally in high school  Vaping Use   Vaping status: Never Used  Substance Use Topics   Alcohol use: Yes    Alcohol/week: 12.0 standard drinks of alcohol    Types: 12 Standard drinks or equivalent per week    Comment: 2-3 drinks daily   Drug use: No    No Known Allergies  Current Meds  Medication Sig   aspirin 81 MG tablet Take 81 mg by mouth daily.   Cholecalciferol 1.25 MG (50000 UT) TABS Take 1,000 Int'l Units/kg/day by mouth daily.   co-enzyme Q-10 30 MG capsule Take 100 mg by mouth daily.   ezetimibe (ZETIA) 10 MG tablet Take 10 mg by mouth daily.   fenofibrate (TRICOR) 48 MG tablet Take 48 mg by mouth daily.   Ibuprofen-diphenhydrAMINE HCl (ADVIL PM) 200-25 MG CAPS Take 200 mg by mouth daily.   Magnesium 250 MG TABS Take 2  tablets by mouth daily.   Melatonin 10 MG TABS Take by mouth.   metFORMIN (GLUCOPHAGE) 500 MG tablet Take 500 mg by mouth 2 (two) times daily with a meal.   Multiple Vitamins-Minerals (MULTIVITAMIN ADULT PO) Take 1 tablet by mouth daily. Centrum   Omega-3 Krill Oil 500 MG CAPS Take 500 mg by mouth in the morning and at bedtime.   ramipril (ALTACE) 5 MG capsule Take 5 mg by mouth daily.   rosuvastatin (CRESTOR) 40 MG tablet Take 40 mg by mouth daily.   zinc gluconate 50 MG tablet Take by mouth.    Objective: BP (!) 149/89   Pulse 79   Ht 5' 11.5 (1.816 m)   Wt 285 lb (129.3 kg)   BMI 39.20 kg/m   Physical Exam:  General: Alert and oriented. and No acute distress. Gait: Right sided antalgic gait.  Evaluation of the right wrist demonstrates mild swelling.  There is no bruising currently.  Tenderness to palpation over the dorsal  aspect of the wrist.  He has pain with ulnar deviation.  He is able to make a fist.  Fingers are warm and well-perfused.  Sensation intact throughout the right hand.  Right foot with diffuse swelling to the great toe.  He has tenderness over the proximal and distal phalanx of the great toe.  Toes are warm and well-perfused.  No obvious deformity.  IMAGING: I personally reviewed images previously obtained from the ED  X-rays of the right wrist demonstrates a an avulsion fracture off the dorsal aspect of the wrist  X-rays of the right great toe demonstrates minimally displaced fractures of the distal phalanx, without step-off, but possible intra-articular involvement     New Medications:  No orders of the defined types were placed in this encounter.     Dwayne DELENA Horde, MD  04/02/2024 10:09 AM

## 2024-04-22 ENCOUNTER — Encounter: Payer: Self-pay | Admitting: Orthopedic Surgery

## 2024-04-22 ENCOUNTER — Other Ambulatory Visit (INDEPENDENT_AMBULATORY_CARE_PROVIDER_SITE_OTHER): Payer: Self-pay

## 2024-04-22 ENCOUNTER — Ambulatory Visit (INDEPENDENT_AMBULATORY_CARE_PROVIDER_SITE_OTHER): Admitting: Orthopedic Surgery

## 2024-04-22 DIAGNOSIS — S92424A Nondisplaced fracture of distal phalanx of right great toe, initial encounter for closed fracture: Secondary | ICD-10-CM

## 2024-04-22 DIAGNOSIS — S6291XA Unspecified fracture of right wrist and hand, initial encounter for closed fracture: Secondary | ICD-10-CM

## 2024-04-22 DIAGNOSIS — S92426D Nondisplaced fracture of distal phalanx of unspecified great toe, subsequent encounter for fracture with routine healing: Secondary | ICD-10-CM

## 2024-04-22 DIAGNOSIS — S6291XD Unspecified fracture of right wrist and hand, subsequent encounter for fracture with routine healing: Secondary | ICD-10-CM

## 2024-04-22 NOTE — Patient Instructions (Signed)
Wrist Fracture Rehab Ask your health care provider which exercises are safe for you. Do exercises exactly as told by your health care provider and adjust them as directed. It is normal to feel mild stretching, pulling, tightness, or discomfort as you do these exercises. Stop right away if you feel sudden pain or your pain gets worse. Do not begin these exercises until told by your health care provider. Stretching and range-of-motion exercises These exercises warm up your muscles and joints and improve the movement and flexibility of your wrist and hand. These exercises also help to relieve pain,numbness, and tingling. Finger flexion and extension Sit or stand with your elbow at your side. Open and stretch your left / right fingers as wide as you can (extension). Hold this position for 10 seconds. Close your left / right fingers into a gentle fist (flexion). Hold this position for 10 seconds. Slowly return to the starting position. Repeat 10 times. Complete this exercise 1-2 times a day. Wrist flexion Bend your left / right elbow to a 90-degree angle (right angle) with your palm facing the floor. Bend your wrist forward so your fingers point toward the floor (flexion). Hold this position for 10 seconds. Slowly return to the starting position. Repeat 10 times. Complete this exercise 1-2 times a day. Wrist extension Bend your left / right elbow to a 90-degree angle (right angle) with your palm facing the floor. Bend your wrist backward so your fingers point toward the ceiling (extension). Hold this position for 10 seconds. Slowly return to the starting position. Repeat 10 times. Complete this exercise 1-2 times a day. Ulnar deviation Bend your left / right elbow to a 90-degree angle (right angle), and rest your forearm on a table with your palm facing down. Keeping your hand flat on the table, bend your left / right wrist toward your small finger (pinkie). This is ulnar deviation. Hold this  position for 10 seconds. Slowly return to the starting position. Repeat 10 times. Complete this exercise 1-2 times a day. Radial deviation Bend your left / right elbow to a 90-degree angle (right angle), and rest your forearm on a table with your palm facing down. Keeping your hand flat on the table, bend your left / right wrist toward your thumb. This is radial deviation. Hold this position for 10 seconds. Slowly return to the starting position. Repeat 10 times. Complete this exercise 1-2 times a day. Forearm rotation, supination Stand or sit with your left / right elbow bent to a 90-degree angle (right angle) at your side. Position your forearm so that the thumb is facing the ceiling (neutral position). Turn (rotate) your palm up toward the ceiling (supination), stopping when you feel a gentle stretch. Hold this position for 10 seconds. Slowly return to the starting position. Repeat 10 times. Complete this exercise 1-2 times a day. Forearm rotation, pronation Stand or sit with your left / right elbow bent to a 90-degree angle (right angle) at your side. Position your forearm so that the thumb is facing the ceiling (neutral position). Turn (rotate) your palm down toward the floor (pronation), stopping when you feel a gentle stretch. Hold this position for 10 seconds. Slowly return to the starting position. Repeat 10 times. Complete this exercise 1-2 times a day. Wrist flexion stretch  Extend your left / right arm in front of you and turn your palm down toward the floor. If told by your health care provider, bend your left / right arm to a 90-degree angle (  right angle) at your side. Using your uninjured hand, gently press over the back of your left / right hand to bend your wrist and fingers toward the floor (flexion). Go as far as you can to feel a stretch without causing pain. Hold this position for 10 seconds. Slowly return to the starting position. Repeat 10 times. Complete this  exercise 1-2 times a day. Wrist extension stretch  Extend your left / right arm in front of you and turn your palm up toward the ceiling. If told by your health care provider, bend your left / right arm to a 90-degree angle (right angle) at your side. Using your uninjured hand, gently press over the palm of your left / right hand to bend your wrist and fingers toward the floor (extension). Go as far as you can to feel a stretch without causing pain. Hold this position for 10 seconds. Slowly return to the starting position. Repeat 10 times. Complete this exercise 1-2 times a day. Forearm rotation stretch, supination Stand or sit with your arms at your sides. Bend your left / right elbow to a 90-degree angle (right angle). Using your uninjured hand, turn your left / right palm up toward the ceiling (assisted supination) until you feel a gentle stretch in the inside of your forearm. Hold this position for 10 seconds. Slowly return to the starting position. Repeat 10 times. Complete this exercise 1-2 times a day. Forearm rotation stretch, pronation Stand or sit with your arms at your sides. Bend your left / right elbow to a 90-degree angle (right angle). Using your uninjured hand, turn your left / right palm down toward the floor (assisted pronation) until you feel a gentle stretch in the top of your forearm. Hold this position for 10 seconds. Slowly return to the starting position. Repeat 10 times. Complete this exercise 1-2 times a day. Strengthening exercises These exercises build strength and endurance in your wrist and hand. Enduranceis the ability to use your muscles for a long time, even after they get tired. Wrist flexion Sit with your left / right forearm supported on a table. Your elbow should be at waist height. Rest your hand over the edge of the table, palm up. Gently grasp a 5 lb / kg weight (can of soup). Or, hold an exercise band or tube in both hands, keeping your hands at  the same level and hip distance apart. There should be slight tension in the exercise band or tube. Without moving your forearm or elbow, slowly bend your wrist up toward the ceiling (wrist flexion). Hold this position for 10 seconds. Slowly return to the starting position. Repeat 10 times. Complete this exercise 1-2 times a day. Wrist extension Sit with your left / right forearm supported on a table. Your elbow should be at waist height. Rest your hand over the edge of the table, palm down. Gently grasp a 5 lb / kg weight. Or, hold an exercise band or tube in both hands, keeping your hands at the same level and hip distance apart. There should be slight tension in the exercise band or tube. Without moving your forearm or elbow, slowly curl your hand up toward the ceiling (extension). Hold this position for 10 seconds. Slowly return to the starting position. Repeat 10 times. Complete this exercise 1-2 times a day. Forearm rotation, supination  Sit with your left / right forearm supported on a table. Your elbow should be at waist height. Rest your hand over the edge of the  table, palm down. Gently grasp a lightweight hammer near the head. As this exercise gets easier for you, try holding the hammer farther down the handle. Without moving your elbow, slowly turn (rotate) your palm up toward the ceiling (supination). Hold this position for 10 seconds. Slowly return to the starting position. Repeat 10 times. Complete this exercise 1-2 times a day. Forearm rotation, pronation  Sit with your left / right forearm supported on a table. Your elbow should be at waist height. Rest your hand over the edge of the table, palm up. Gently grasp a lightweight hammer near the head. As this exercise gets easier for you, try holding the hammer farther down the handle. Without moving your elbow, slowly turn (rotate) your palm down toward the floor (pronation). Hold this position for 10 seconds. Slowly return  to the starting position. Repeat 10 times. Complete this exercise 1-2 times a day. Grip strengthening  Hold one of these items in your left / right hand: a dense sponge, a stress ball, or a large, rolled sock. Slowly squeeze the object as hard as you can without increasing any pain. Hold your squeeze for 10 seconds. Slowly release your grip. Repeat 10 times. Complete this exercise 1-2 times a day. This information is not intended to replace advice given to you by your health care provider. Make sure you discuss any questions you have with your healthcare provider. Document Revised: 10/02/2019 Document Reviewed: 10/02/2019 Elsevier Patient Education  [employer].

## 2024-04-22 NOTE — Progress Notes (Signed)
 Return patient Visit  Assessment: Dwayne Kennedy is a 68 y.o. male with the following: 1. Closed fracture of right hand, subsequent encounter 2. Nondisplaced fracture of distal phalanx of right great toe, subsequent encounter for closed fracture  Plan: Dwayne Kennedy continues to improve.  Radiographs obtained today demonstrate stable alignment of the right great toe distal phalanx fracture, as well as an avulsion fracture on the dorsum of the right wrist.  He can start to wean out of the brace.  I provided him with exercises for his right wrist.  He can start to increase his activity with the right foot.  Buddy tape as needed.  Okay to start bearing weight through the forefoot.  He states understanding.  Follow-up in 1 month.   Follow-up: Return in about 4 weeks (around 05/20/2024).  Subjective:  Chief Complaint  Patient presents with   Hand Injury    Right/ improving can't use it very much at this point/ using splint   Foot Pain    Right Gt toe    History of Present Illness: Dwayne Kennedy is a 68 y.o. male who returns for repeat evaluation of right wrist and right foot pain.  He sustained a fall about a month ago.  He has been buddy taping his great toe on the right.  He has been wearing a brace for his right wrist.  Pain is improving.  His PCP told him to stop taking Tylenol due to some issues with his blood work.  He notes improved motion of the right wrist, but has difficulty with some activities.  He continues to bear weight through the heel on the right foot.   Review of Systems: No fevers or chills No numbness or tingling No chest pain No shortness of breath No bowel or bladder dysfunction No GI distress No headaches      Objective: There were no vitals taken for this visit.  Physical Exam:  General: Alert and oriented. and No acute distress. Gait: Right sided antalgic gait.  Right wrist without swelling.  No bruising.  Mild tenderness to palpation of the dorsum of  the wrist.  He has decent extension and flexion at the wrist.  Pain with radial and ulnar deviation.  He is able to make a fist.  Right foot without swelling.  Tenderness to palpation of the distal phalanx of the great toe.  No deformity.  No bruising.     IMAGING: I personally ordered and reviewed the following images  X-rays of the right wrist were obtained in clinic today.  No acute injuries are noted.  Small avulsion fracture of the dorsum of the wrist.  This is in unchanged alignment.  No new injuries.  No interval displacement.  Impression: Stable right wrist dorsal avulsion fracture   X-rays of the right foot were obtained in clinic today.  No acute injuries or noted.  Distal phalanx of the great toe remains in unchanged alignment.  There are no obvious fractures.  No dislocation.  No callus formation.  No interval displacement.  Impression: Stable right great toe distal phalanx fracture    New Medications:  No orders of the defined types were placed in this encounter.     Oneil DELENA Horde, MD  04/22/2024 9:19 AM

## 2024-04-30 ENCOUNTER — Encounter: Payer: Self-pay | Admitting: Gastroenterology

## 2024-05-20 ENCOUNTER — Other Ambulatory Visit: Payer: Self-pay

## 2024-05-20 ENCOUNTER — Other Ambulatory Visit: Payer: Self-pay | Admitting: Orthopedic Surgery

## 2024-05-20 ENCOUNTER — Ambulatory Visit: Admitting: Orthopedic Surgery

## 2024-05-20 ENCOUNTER — Encounter: Payer: Self-pay | Admitting: Orthopedic Surgery

## 2024-05-20 DIAGNOSIS — S6291XD Unspecified fracture of right wrist and hand, subsequent encounter for fracture with routine healing: Secondary | ICD-10-CM | POA: Diagnosis not present

## 2024-05-20 DIAGNOSIS — S92426D Nondisplaced fracture of distal phalanx of unspecified great toe, subsequent encounter for fracture with routine healing: Secondary | ICD-10-CM

## 2024-05-20 DIAGNOSIS — S92424A Nondisplaced fracture of distal phalanx of right great toe, initial encounter for closed fracture: Secondary | ICD-10-CM | POA: Diagnosis not present

## 2024-05-20 NOTE — Progress Notes (Signed)
 Return patient Visit  Assessment: Dwayne Kennedy is a 68 y.o. male with the following: 1. Closed fracture of right hand, subsequent encounter 2. Nondisplaced fracture of distal phalanx of right great toe, subsequent encounter for closed fracture  Plan: Dwayne Kennedy is doing better.  Pain and swelling in the right foot has improved.  He notes some worsening pain with extended periods of walking, or walking on uneven terrain.  This is normal.  Advised him to consider stiff soled shoe, or carbon fiber insert as needed.  His right hand feels better.  He notes some stiffness and pain, if he overdoes it.  Advised him to continue to work on motion and strength.  He states understanding.  He also mentions that he feels as though he is developing a trigger finger, and if this worsens, he will schedule another appointment.   Follow-up: Return if symptoms worsen or fail to improve.  Subjective:  Chief Complaint  Patient presents with   Fracture     R wrist R big toe DOI 03/24/24      History of Present Illness: Dwayne Kennedy is a 68 y.o. male who returns for repeat evaluation of right wrist and right foot pain.  He sustained an injury to his right wrist, as well as his right great toe about 2 months ago.  He is doing better overall.  He is no longer using a brace.  Occasional medications.  He is not buddy taping.  He states that he went to [facility] 1-2 weeks ago, did a lot of walking.  The cobblestone roads irritated his right foot.  Otherwise, he is doing well.   Review of Systems: No fevers or chills No numbness or tingling No chest pain No shortness of breath No bowel or bladder dysfunction No GI distress No headaches      Objective: There were no vitals taken for this visit.  Physical Exam:  General: Alert and oriented. and No acute distress. Gait: Right sided antalgic gait.  Right wrist without swelling.  No bruising.  No tenderness to palpation.  He is good range of motion.   Grip strength is improving.  Fingers are warm and well-perfused.  Right foot with a mild amount of swelling to the great toe.  Tenderness to the distal phalanx.  Flexion of the IP joint is tender..     IMAGING: I personally ordered and reviewed the following images  X-rays of the right wrist were obtained in clinic today.  These are compared to available x-rays.  No change in overall alignment.  Avulsion fracture is not readily apparent.  No dislocation.  No bony lesions.  Impression: Healed right wrist avulsion fracture  X-rays of the right great toe were obtained in clinic today.  These are compared to prior x-rays.  There is no change in overall alignment.  There has been interval consolidation.  No bony lesions.  No new injuries.  No dislocation.  Impression: Healed right great toe fracture    New Medications:  No orders of the defined types were placed in this encounter.     Dwayne DELENA Horde, MD  05/20/2024 9:17 AM

## 2024-06-18 ENCOUNTER — Encounter: Payer: Self-pay | Admitting: Gastroenterology

## 2024-06-18 ENCOUNTER — Ambulatory Visit: Admitting: Gastroenterology

## 2024-06-18 VITALS — BP 130/73 | HR 78 | Temp 98.0°F | Ht 72.0 in | Wt 280.8 lb

## 2024-06-18 DIAGNOSIS — F1091 Alcohol use, unspecified, in remission: Secondary | ICD-10-CM | POA: Diagnosis not present

## 2024-06-18 DIAGNOSIS — Z860101 Personal history of adenomatous and serrated colon polyps: Secondary | ICD-10-CM | POA: Diagnosis not present

## 2024-06-18 DIAGNOSIS — R195 Other fecal abnormalities: Secondary | ICD-10-CM | POA: Insufficient documentation

## 2024-06-18 DIAGNOSIS — R7989 Other specified abnormal findings of blood chemistry: Secondary | ICD-10-CM | POA: Diagnosis not present

## 2024-06-18 NOTE — Patient Instructions (Signed)
 Please complete labs today at [address] in [place] ([facility]).  Continue to refrain for all alcohol use.  Try to incorporate more exercise, 150 minutes per week minimum.  Eat a low fat, low sugar diet.   Strive for 5-10% weight reduction. Consider counting calories, you may be getting more than you think.   Avoid sugary drinks. Drink more water.   I have included a diet used for non-alcohol related fatty liver disease.

## 2024-06-18 NOTE — Progress Notes (Signed)
 "    GI Office Note    Referring Provider: Trudy Vaughn FALCON, MD Primary Care Physician:  Trudy Vaughn FALCON, MD  Primary Gastroenterologist: Carlin POUR. Cindie, DO  Chief Complaint   Chief Complaint  Patient presents with   abnormal liver labs     History of Present Illness   Dwayne Kennedy is a 69 y.o. male presenting today at the request of Dr. Trudy for further evaluation of abnormal LFTs.   Patient noted to have abnormal LFTs by PCP on November 17.  His LFTs had been normal in April 2025.  Bump in AST 144, ALT 154.  PCP advised holding fenofibrate, rosuvastatin, Zetia, patient had been off of Tylenol for 2 weeks.  Patient notes that he was taking 1.5 g of Tylenol twice a day for orthopedic injuries.  Repeat LFTs with improvement of AST/ALT into the 80s/90s.  Discussed the use of AI scribe software for clinical note transcription with the patient, who gave verbal consent to proceed.  History of Present Illness  Abnormal liver enzymes were first noted in November 2025, with normal values in April 2025. Per patient his ultrasound showed an enlarged fatty liver. We have requested records. In response, he stopped cholesterol medications (rosuvastatin, fenofibrate, Zetia), Tylenol and Advil PM.  He has abstained from alcohol for the past two months. Before abstinence he drank two to three or more oversized drinks daily.  Some days he may not drink any.  He used Tylenol after a right wrist fracture and toe injury at the end of October 2025, initially two to three 500 mg tablets every twelve hours for one to two days, then tapered and stopped after learning of the abnormal liver tests. He has started no new herbal supplements and did not take recently purchased turmeric. Current supplements are CoQ10, vitamin D, zinc, magnesium, and melatonin. He does not use biotin or other hair, skin, or nail products.  Does not use turmeric.  He has intermittent lower back pain that radiates sometimes  around the flanks.  History of loose stools on metformin but feels like his stools have worsened, now more watery, now up to four times daily. He has no regular heartburn, indigestion, or right upper quadrant pain.  No nausea or vomiting.  Appetite stable.  Weight has fluctuated but is trending upward despite smaller portions and two meals per day. Physical activity is limited to walking his dog twice weekly for 20-25 minutes and part-time work.  He previously had visible blood in the stool, which prompted three colonoscopies, most recently in March 2025, with polyps on the last two exams. He is planned for a three-year surveillance colonoscopy at [facility].  Requiring EMR for tubulovillous adenomas. Family history is notable for colonic polyps in his mother and brother. There is no family history of liver disease, colon cancer, celiac disease, or Crohn's disease.  He reports ultrasound was obtained and he was told his liver was enlarged.  We have requested records.  Said he had a fatty liver.  Risk factors include alcohol use, hypercholesterolemia, hypertension, prediabetes, obesity.       Prior Data   Results    04/28/2024: Sodium 138, potassium 4.5, BUN 13, creatinine 0.77, total bilirubin 0.6, alk phos 89, AST 89, ALT 95  04/21/2024: White blood cell count 6000, hemoglobin 13.6, platelets 225, total bilirubin 0.8, alk phos 76, AST 144, ALT 154, TSH 7.107, vitamin D30.08 September 2023: Total bilirubin 0.6, alk phos 44, AST 25, ALT 16  Colonoscopy March  2025: [facility] -5 mm sigmoid colon polyp removed, tubular adenoma - Post mucosal ectomy scar in the ascending colon -Hemorrhoids -per PCP advised to follow in 3 years  Colonoscopy September 2024: -seven to 10 mm polyps in the sigmoid colon, transverse colon, ascending colon, cecum. 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     aspirin 81 MG tablet Take 81 mg by mouth daily.     Cholecalciferol 1.25 MG (50000 UT) TABS Take 1,000 Int'l Units/kg/day by mouth daily.     co-enzyme Q-10 30 MG capsule Take 100 mg by mouth daily.     ezetimibe (ZETIA) 10 MG tablet Take 10 mg by mouth daily.     fenofibrate (TRICOR) 48 MG tablet Take 48 mg by mouth daily.     Magnesium 250 MG TABS Take 2 tablets by mouth daily.     Melatonin 10 MG TABS Take by mouth.     metFORMIN (GLUCOPHAGE-XR) 500 MG 24 hr tablet Take 500 mg by mouth 2 (two) times daily.     Multiple Vitamins-Minerals (MULTIVITAMIN ADULT PO) Take 1 tablet by mouth daily. Centrum     Omega-3 Krill Oil 500 MG CAPS Take 500 mg by mouth in the morning and at bedtime.     ramipril (ALTACE) 10 MG capsule Take 10 mg by mouth daily.     zinc gluconate 50 MG tablet Take by mouth.     rosuvastatin (CRESTOR) 40 MG tablet Take 40 mg by mouth daily. (Patient not taking: Reported on 06/18/2024)     No current facility-administered medications for this visit.    Allergies   Allergies as of 06/18/2024   (No Known Allergies)    Past Medical History   Past Medical History:  Diagnosis Date   Diabetes mellitus without complication (HCC)    Hyperlipidemia    Hypertension    Mitral valve prolapse     Past Surgical History   Past Surgical History:  Procedure Laterality Date   CARPAL TUNNEL RELEASE Right    ROTATOR CUFF REPAIR Right     Past Family History   Family History  Problem Relation Age of Onset   Cancer Mother    Colon polyps Mother    Diabetes Father    Heart disease Father    Colon polyps Brother    Other Child        meningitis - COD at age 63   Colon cancer Neg Hx    Celiac disease Neg Hx     Inflammatory bowel disease Neg Hx     Past Social History   Social History   Socioeconomic History   Marital status: Married    Spouse name: Not on file   Number of children: 2   Years of education: 12   Highest education level: Not on file  Occupational History   Occupation: diplomatic services operational officer    Employer: [employer]  Tobacco Use   Smoking status: Never    Passive exposure: Yes   Smokeless tobacco: Never   Tobacco comments:    smoked occasionally in high school  Vaping Use   Vaping status: Never Used  Substance and Sexual Activity   Alcohol use: Yes    Alcohol/week: 12.0 standard drinks of alcohol    Types: 12 Standard drinks or equivalent per week    Comment: 2-3 drinks daily   Drug use: No   Sexual activity: Not on file  Other Topics Concern   Not on file  Social History Narrative   epworth sleepiness scale = 5 (09/10/15)   Social Drivers of Health   Tobacco Use: Medium Risk (06/18/2024)   Patient History    Smoking Tobacco Use: Never    Smokeless Tobacco Use: Never    Passive Exposure: Yes  Financial Resource Strain: Patient Declined (04/03/2022)   Received from [facility]   Overall Financial Resource Strain (CARDIA)    Difficulty of Paying Living Expenses: Patient declined  Food Insecurity: Patient Declined (04/03/2022)   Received from [facility]   Epic    Within the past 12 months, you worried that your food would run out before you got the money to buy more.: Patient declined    Within the past 12 months, the food you bought just didn't last and you didn't have money to get more.: Patient declined  Transportation Needs: Not on file  Physical Activity: Insufficiently Active (04/03/2022)   Received from [facility]   Exercise Vital Sign    On average, how many days per week do you engage in moderate to strenuous exercise (like a brisk walk)?: 2 days    On average, how many minutes do you engage in exercise at this level?: 20 min  Stress: Stress  Concern Present (04/03/2022)   Received from [facility] - Occupational Stress Questionnaire    Feeling of Stress : Rather much  Social Connections: Unknown (10/03/2021)   Received from [facility]   Social Network    Social Network: Not on file  Intimate Partner Violence: Not At Risk (04/03/2022)   Received from [facility]   HITS    Over the last 12 months how often did your partner physically hurt you?: Never    Over the last 12 months how often did your partner insult you or talk down to you?: Never    Over the last 12 months how often did your partner threaten you with physical harm?: Never    Over the last 12 months how often did your partner scream or curse at you?: Never  Depression (PHQ2-9): Not on file  Alcohol Screen: Not on file  Housing: Unknown (03/28/2024)   Received from [facility]   Epic    Unable to Pay for Housing in the Last Year: Not on file    Number of Times Moved in the Last Year: Not on file    At any time in the past 12 months, were you homeless or living in a shelter (including now)?: No  Utilities: Not on file  Health Literacy: Not on file    Review of Systems   General: Negative for anorexia, weight loss, fever, chills, fatigue, weakness. Eyes: Negative for vision changes.  ENT: Negative for hoarseness, difficulty swallowing , nasal congestion. CV: Negative for chest pain, angina, palpitations, dyspnea on exertion, peripheral edema.  Respiratory: Negative for dyspnea at rest, dyspnea on exertion, cough, sputum, wheezing.  GI: See history of present illness. GU:  Negative for dysuria, hematuria, urinary incontinence, urinary frequency, nocturnal urination.  MS: Negative for joint pain. See hpi Derm: Negative for rash or itching.  Neuro: Negative for  weakness, abnormal sensation, seizure, frequent headaches, memory loss,  confusion.  Psych: Negative for anxiety, depression, suicidal  ideation, hallucinations.  Endo: Negative for unusual weight change.  Heme: Negative for bruising or bleeding. Allergy: Negative for rash or hives.  Physical Exam   BP (!) 157/81   Pulse 80   Temp 98 F (36.7 C) (Oral)   Ht 6' (1.829 m)   Wt 280 lb 12.8 oz (127.4 kg)   SpO2 94%   BMI 38.08 kg/m    General: Well-nourished, well-developed in no acute distress. Obese. Red complexion. Head: Normocephalic, atraumatic.   Eyes: Conjunctiva pink, no icterus. Mouth: Oropharyngeal mucosa moist and pink  Neck: Supple without thyromegaly, masses, or lymphadenopathy.  Lungs: Clear to auscultation bilaterally.  Heart: Regular rate and rhythm, no murmurs rubs or gallops.  Abdomen: Bowel sounds are normal, nontender, nondistended, no hepatosplenomegaly or masses,  no abdominal bruits or hernia, no rebound or guarding.  Rectus diastasis. Rectal: not performed Extremities: No lower extremity edema. No clubbing or deformities.  Neuro: Alert and oriented x 4 , grossly normal neurologically.  Skin: Warm and dry, no rash or jaundice.   Psych: Alert and cooperative, normal mood and affect.  Labs   See above  Imaging Studies   DG Toe Great Right Result Date: 05/26/2024 X-rays of the right great toe were obtained in clinic today.  These are compared to prior x-rays.  There is no change in overall alignment.  There has been interval consolidation.  No bony lesions.  No new injuries.  No dislocation.  Impression: Healed right great toe fracture   DG Wrist Complete Right Result Date: 05/26/2024 X-rays of the right wrist were obtained in clinic today.  These are compared to available x-rays.  No change in overall alignment.  Avulsion fracture is not readily apparent.  No dislocation.  No bony lesions.  Impression: Healed right wrist avulsion fracture    Assessment/Plan:    Assessment & Plan Elevated LFTs: -normal one year ago, recent bump in 04/2024 with some improvement on recheck. He reports  enlarged fatty liver on ultrasound. We have requested reports -previous Hep A/B vaccines. 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 If persistent symptoms and negative celiac screen, would advise further work up     History of advanced adenomatous colon polyps removed by EMR at [facility]: -he will continue surveillance colonoscopies at [facility].          Sonny RAMAN. Ezzard, MHS, PA-C [facility]  "

## 2024-06-25 ENCOUNTER — Telehealth: Payer: Self-pay

## 2024-06-25 ENCOUNTER — Ambulatory Visit: Payer: Self-pay | Admitting: Gastroenterology

## 2024-06-25 DIAGNOSIS — R195 Other fecal abnormalities: Secondary | ICD-10-CM

## 2024-06-25 DIAGNOSIS — R7989 Other specified abnormal findings of blood chemistry: Secondary | ICD-10-CM

## 2024-06-25 LAB — CBC WITH DIFFERENTIAL/PLATELET
Basophils Absolute: 0.1 x10E3/uL (ref 0.0–0.2)
Basos: 1 %
EOS (ABSOLUTE): 0.1 x10E3/uL (ref 0.0–0.4)
Eos: 2 %
Hematocrit: 46.2 % (ref 37.5–51.0)
Hemoglobin: 15.2 g/dL (ref 13.0–17.7)
Immature Grans (Abs): 0 x10E3/uL (ref 0.0–0.1)
Immature Granulocytes: 0 %
Lymphocytes Absolute: 1.5 x10E3/uL (ref 0.7–3.1)
Lymphs: 22 %
MCH: 29 pg (ref 26.6–33.0)
MCHC: 32.9 g/dL (ref 31.5–35.7)
MCV: 88 fL (ref 79–97)
Monocytes Absolute: 0.5 x10E3/uL (ref 0.1–0.9)
Monocytes: 8 %
Neutrophils Absolute: 4.5 x10E3/uL (ref 1.4–7.0)
Neutrophils: 66 %
Platelets: 285 x10E3/uL (ref 150–450)
RBC: 5.25 x10E6/uL (ref 4.14–5.80)
RDW: 13.1 % (ref 11.6–15.4)
WBC: 6.7 x10E3/uL (ref 3.4–10.8)

## 2024-06-25 LAB — HEPATIC FUNCTION PANEL
ALT: 43 IU/L (ref 0–44)
AST: 33 IU/L (ref 0–40)
Albumin: 4.6 g/dL (ref 3.9–4.9)
Alkaline Phosphatase: 100 IU/L (ref 47–123)
Bilirubin Total: 0.3 mg/dL (ref 0.0–1.2)
Bilirubin, Direct: 0.1 mg/dL (ref 0.00–0.40)
Total Protein: 7.6 g/dL (ref 6.0–8.5)

## 2024-06-25 LAB — MITOCHONDRIAL ANTIBODIES: Mitochondrial Ab: <20 U (ref 0.0–20.0)

## 2024-06-25 LAB — IRON,TIBC AND FERRITIN PANEL
Ferritin: 79 ng/mL (ref 30–400)
Iron Saturation: 18 % (ref 15–55)
Iron: 67 ug/dL (ref 38–169)
Total Iron Binding Capacity: 381 ug/dL (ref 250–450)
UIBC: 314 ug/dL (ref 111–343)

## 2024-06-25 LAB — HEPATITIS C ANTIBODY: Hep C Virus Ab: NONREACTIVE

## 2024-06-25 LAB — IGG, IGA, IGM
IgG (Immunoglobin G), Serum: 1174 mg/dL (ref 603–1613)
IgM (Immunoglobulin M), Srm: 114 mg/dL (ref 20–172)
Immunoglobulin A, (IgA) QN, Serum: 207 mg/dL (ref 61–437)

## 2024-06-25 LAB — HEPATITIS B SURFACE ANTIGEN: Hepatitis B Surface Ag: NEGATIVE

## 2024-06-25 LAB — ANTINUCLEAR ANTIBODIES, IFA: ANA Titer 1: NEGATIVE

## 2024-06-25 LAB — HEPATITIS A ANTIBODY, TOTAL: hep A Total Ab: POSITIVE — AB

## 2024-06-25 LAB — HEPATITIS B SURFACE ANTIBODY,QUALITATIVE: Hep B Surface Ab, Qual: REACTIVE

## 2024-06-25 LAB — HEPATITIS B CORE ANTIBODY, TOTAL: Hep B Core Total Ab: NEGATIVE

## 2024-06-25 LAB — TISSUE TRANSGLUTAMINASE, IGA: t-Transglutaminase (tTG) IgA: <2 U/mL (ref 0–3)

## 2024-06-25 LAB — ANTI-SMOOTH MUSCLE AB BY IFA: Anti-Smooth Muscle Ab by IFA: 1:40 {titer} — ABNORMAL HIGH

## 2024-06-25 NOTE — Telephone Encounter (Signed)
 See result note.

## 2024-06-25 NOTE — Telephone Encounter (Signed)
 Pt's wife called regarding pt's recent labs and was curious about results and recommendations. Informed pt's wife that Sonny was out of the office and that the results were resulted today and we would be back in touch with them within 5 business days. Pt's wife verbalized understanding.

## 2024-06-26 NOTE — Addendum Note (Signed)
 Addended by: WELLINGTON MILLING on: 06/26/2024 08:23 AM   Modules accepted: Orders

## 2024-07-04 ENCOUNTER — Ambulatory Visit: Admitting: Urology

## 2024-07-04 ENCOUNTER — Encounter: Payer: Self-pay | Admitting: Urology

## 2024-07-04 VITALS — BP 124/75 | HR 76

## 2024-07-04 DIAGNOSIS — N281 Cyst of kidney, acquired: Secondary | ICD-10-CM | POA: Diagnosis not present

## 2024-07-04 DIAGNOSIS — R310 Gross hematuria: Secondary | ICD-10-CM

## 2024-07-04 NOTE — Patient Instructions (Signed)
 Blood in the Pee (Hematuria) in Adults: What to Know  Hematuria is blood in the pee. You may be able to see blood in the pee. In some cases, a health care provider may find blood with a test.  Blood in the pee can be caused by infections of the kidney, bladder, or the urethra. The urethra is the tube that drains pee from the bladder.  Other causes may include: Kidney stones. Infection of the prostate. Cancer. Too much calcium in the pee. Conditions that are passed from parent to child. Too much exercise. Infections can be treated with medicine. A kidney stone will usually leave your body when you pee. If infections or kidney stones didn't cause the blood in the urine, then more tests may be needed. It is very important to tell your provider about any blood in your pee, even if you have no pain or the blood stops with no treatment. Blood in the pee can be a sign of a very serious problem, such as cancer. Follow these instructions at home: Medicines Take your medicines only as told. If you were given antibiotics, take them as told. Do not stop taking them even if you start to feel better. Eating and drinking Drink more fluids as told. Aim to drink 3-4 quarts (2.8-3.8 L) a day. Avoid caffeine, tea, and carbonated drinks. These can bother the bladder. Avoid alcohol if a male because it may irritate the prostate. General instructions If you have been diagnosed with a kidney stone, strain your pee to catch the stone if told by your provider. Empty your bladder often. Avoid holding pee for a long time. If you're male, make sure that: You wipe from front to back after using the bathroom. You use each piece of toilet paper only once. You pee before and after sex. It's up to you to get the results of any tests. Ask when your results will be ready and how to get them. You may need to call or meet with your provider to get your results. Keep all follow-up visits. Your provider will need to know  about any changes or any new symptoms. Contact a health care provider if: Your symptoms don't get better after 3 days. Your symptoms get worse. You have back pain or belly pain. You have a fever or chills. You throw up or feel like you may throw up. You throw up every time you take medicine. Get help right away if: You pass blood clots in your pee. You pass out. These symptoms may be an emergency. Call 911 right away. Do not wait to see if the symptoms will go away. Do not drive yourself to the hospital. This information is not intended to replace advice given to you by your health care provider. Make sure you discuss any questions you have with your health care provider. Document Revised: 03/08/2023 Document Reviewed: 02/15/2023 Elsevier Patient Education  2024 [employer].

## 2024-07-04 NOTE — Progress Notes (Signed)
 "  07/04/2024 10:05 AM   Dwayne Kennedy May 29, 1956 [id number]  Referring provider: Trudy Vaughn FALCON, MD [address] [place],  [place] [address]  Renal cysts   HPI: Dwayne Kennedy is a 69yo here fro evaluation of renal cysts. He underwent renal US  at [facility] and per the report he has 3 right renal cysts. They did not see any evidence of solid renal masses. IPSS 8 QOL 3 on no BPh therapy. Nocturia 2x. No straining to urinate. Urine stream strong. He has intermittent painless dark red urine. No history of UTI. No history of nephrolithiasis. He has intermittent right flank pain.    PMH: Past Medical History:  Diagnosis Date   Diabetes mellitus without complication (HCC)    Hyperlipidemia    Hypertension    Hypertriglyceridemia    Mitral valve prolapse     Surgical History: Past Surgical History:  Procedure Laterality Date   CARPAL TUNNEL RELEASE Right    ROTATOR CUFF REPAIR Right     Home Medications:  Allergies as of 07/04/2024   No Known Allergies      Medication List        Accurate as of July 04, 2024 10:05 AM. If you have any questions, ask your nurse or doctor.          amLODipine 2.5 MG tablet Commonly known as: NORVASC Take 2.5 mg by mouth daily.   aspirin 81 MG tablet Take 81 mg by mouth daily.   Cholecalciferol 1.25 MG (50000 UT) Tabs Take 1,000 Int'l Units/kg/day by mouth daily.   co-enzyme Q-10 30 MG capsule Take 100 mg by mouth daily.   ezetimibe 10 MG tablet Commonly known as: ZETIA Take 10 mg by mouth daily.   fenofibrate 48 MG tablet Commonly known as: TRICOR Take 48 mg by mouth daily.   Magnesium 250 MG Tabs Take 2 tablets by mouth daily.   Melatonin 10 MG Tabs Take by mouth.   metFORMIN 500 MG 24 hr tablet Commonly known as: GLUCOPHAGE-XR Take 500 mg by mouth 2 (two) times daily.   MULTIVITAMIN ADULT PO Take 1 tablet by mouth daily. Centrum   Omega-3 Krill Oil 500 MG Caps Take 500 mg by mouth in the morning and at  bedtime.   ramipril 10 MG capsule Commonly known as: ALTACE Take 10 mg by mouth daily.   rosuvastatin 40 MG tablet Commonly known as: CRESTOR Take 40 mg by mouth daily.   zinc gluconate 50 MG tablet Take by mouth.        Allergies: Allergies[1]  Family History: Family History  Problem Relation Age of Onset   Cancer Mother    Colon polyps Mother    Diabetes Father    Heart disease Father    Colon polyps Brother    Other Child        meningitis - COD at age 76   Colon cancer Neg Hx    Celiac disease Neg Hx    Inflammatory bowel disease Neg Hx     Social History:  reports that he has never smoked. He has been exposed to tobacco smoke. He has never used smokeless tobacco. He reports current alcohol use of about 12.0 standard drinks of alcohol per week. He reports that he does not use drugs.  ROS: All other review of systems were reviewed and are negative except what is noted above in HPI  Physical Exam: BP 124/75   Pulse 76   Constitutional:  Alert and oriented, No  acute distress. HEENT: [place] AT, moist mucus membranes.  Trachea midline, no masses. Cardiovascular: No clubbing, cyanosis, or edema. Respiratory: Normal respiratory effort, no increased work of breathing. GI: Abdomen is soft, nontender, nondistended, no abdominal masses GU: No CVA tenderness.  Lymph: No cervical or inguinal lymphadenopathy. Skin: No rashes, bruises or suspicious lesions. Neurologic: Grossly intact, no focal deficits, moving all 4 extremities. Psychiatric: Normal mood and affect.  Laboratory Data: Lab Results  Component Value Date   WBC 6.7 06/18/2024   HGB 15.2 06/18/2024   HCT 46.2 06/18/2024   MCV 88 06/18/2024   PLT 285 06/18/2024    No results found for: CREATININE  No results found for: PSA  No results found for: TESTOSTERONE  No results found for: HGBA1C  Urinalysis No results found for: COLORURINE, APPEARANCEUR, LABSPEC, PHURINE, GLUCOSEU, HGBUR,  BILIRUBINUR, KETONESUR, PROTEINUR, UROBILINOGEN, NITRITE, LEUKOCYTESUR  No results found for: LABMICR, WBCUA, RBCUA, LABEPIT, MUCUS, BACTERIA  Pertinent Imaging:  No results found for this or any previous visit.  No results found for this or any previous visit.  No results found for this or any previous visit.  No results found for this or any previous visit.  No results found for this or any previous visit.  No results found for this or any previous visit.  No results found for this or any previous visit.  No results found for this or any previous visit.   Assessment & Plan:    1. Renal cyst (Primary) CT hematuria protocol  2. Gross hematuria -bmp -CT hematuria -office cystoscopy   No follow-ups on file.  Belvie Clara, MD  [facility] [place]      [1] No Known Allergies  "

## 2024-07-05 LAB — BASIC METABOLIC PANEL WITH GFR
BUN/Creatinine Ratio: 12 (ref 10–24)
BUN: 12 mg/dL (ref 8–27)
CO2: 24 mmol/L (ref 20–29)
Calcium: 9.7 mg/dL (ref 8.6–10.2)
Chloride: 102 mmol/L (ref 96–106)
Creatinine, Ser: 1.01 mg/dL (ref 0.76–1.27)
Glucose: 155 mg/dL — ABNORMAL HIGH (ref 70–99)
Potassium: 4.3 mmol/L (ref 3.5–5.2)
Sodium: 140 mmol/L (ref 134–144)
eGFR: 81 mL/min/{1.73_m2}

## 2024-07-09 ENCOUNTER — Other Ambulatory Visit: Payer: Self-pay

## 2024-07-09 DIAGNOSIS — R7989 Other specified abnormal findings of blood chemistry: Secondary | ICD-10-CM

## 2024-07-30 ENCOUNTER — Ambulatory Visit (HOSPITAL_COMMUNITY)

## 2024-08-27 ENCOUNTER — Other Ambulatory Visit: Admitting: Urology
